# Patient Record
Sex: Female | Born: 1990 | Race: White | Hispanic: No | Marital: Married | Smoking: Current every day smoker
Health system: Southern US, Community
[De-identification: ages and names within clinical notes are randomized; demographics above are authoritative.]

## PROBLEM LIST (undated history)

## (undated) DIAGNOSIS — E0590 Thyrotoxicosis, unspecified without thyrotoxic crisis or storm: Secondary | ICD-10-CM

## (undated) HISTORY — PX: NO PAST SURGERIES: SHX2092

---

## 2017-03-27 ENCOUNTER — Encounter (HOSPITAL_COMMUNITY): Payer: Self-pay | Admitting: *Deleted

## 2017-03-27 ENCOUNTER — Emergency Department (HOSPITAL_COMMUNITY)
Admission: EM | Admit: 2017-03-27 | Discharge: 2017-03-27 | Disposition: A | Discharge status: Home / Self Care | Payer: Self-pay | Attending: Emergency Medicine | Admitting: Emergency Medicine

## 2017-03-27 DIAGNOSIS — R1031 Right lower quadrant pain: Secondary | ICD-10-CM | POA: Insufficient documentation

## 2017-03-27 DIAGNOSIS — Z5321 Procedure and treatment not carried out due to patient leaving prior to being seen by health care provider: Secondary | ICD-10-CM | POA: Insufficient documentation

## 2017-03-27 LAB — URINALYSIS, ROUTINE W REFLEX MICROSCOPIC
Bilirubin Urine: NEGATIVE
Glucose, UA: NEGATIVE mg/dL
Ketones, ur: NEGATIVE mg/dL
LEUKOCYTES UA: NEGATIVE
NITRITE: NEGATIVE
PROTEIN: NEGATIVE mg/dL
Specific Gravity, Urine: 1.025 (ref 1.005–1.030)
pH: 6 (ref 5.0–8.0)

## 2017-03-27 LAB — COMPREHENSIVE METABOLIC PANEL
ALK PHOS: 64 U/L (ref 38–126)
ALT: 39 U/L (ref 14–54)
AST: 26 U/L (ref 15–41)
Albumin: 4.2 g/dL (ref 3.5–5.0)
Anion gap: 9 (ref 5–15)
BUN: 18 mg/dL (ref 6–20)
CALCIUM: 9.1 mg/dL (ref 8.9–10.3)
CO2: 24 mmol/L (ref 22–32)
CREATININE: 0.83 mg/dL (ref 0.44–1.00)
Chloride: 106 mmol/L (ref 101–111)
Glucose, Bld: 124 mg/dL — ABNORMAL HIGH (ref 65–99)
Potassium: 3.7 mmol/L (ref 3.5–5.1)
Sodium: 139 mmol/L (ref 135–145)
Total Bilirubin: 0.5 mg/dL (ref 0.3–1.2)
Total Protein: 7.1 g/dL (ref 6.5–8.1)

## 2017-03-27 LAB — URINALYSIS, MICROSCOPIC (REFLEX)

## 2017-03-27 LAB — CBC
HCT: 46 % (ref 36.0–46.0)
Hemoglobin: 15.2 g/dL — ABNORMAL HIGH (ref 12.0–15.0)
MCH: 27 pg (ref 26.0–34.0)
MCHC: 33 g/dL (ref 30.0–36.0)
MCV: 81.7 fL (ref 78.0–100.0)
PLATELETS: 255 10*3/uL (ref 150–400)
RBC: 5.63 MIL/uL — AB (ref 3.87–5.11)
RDW: 13.1 % (ref 11.5–15.5)
WBC: 8.7 10*3/uL (ref 4.0–10.5)

## 2017-03-27 LAB — LIPASE, BLOOD: LIPASE: 33 U/L (ref 11–51)

## 2017-03-27 NOTE — ED Notes (Signed)
Patient left without speaking with Nurse First

## 2017-03-27 NOTE — ED Triage Notes (Signed)
Pt is here with right lower abdomen that radiates to the back for the last 3 weeks.  Pt also reports the pain increases with sex.  Pt sent here for an ultrasound to check it out.  LMP yesterday stopped

## 2017-03-27 NOTE — ED Notes (Signed)
Patient left without notifying Nurse first.

## 2017-04-21 ENCOUNTER — Encounter (HOSPITAL_COMMUNITY): Payer: Self-pay

## 2017-04-21 DIAGNOSIS — R1031 Right lower quadrant pain: Secondary | ICD-10-CM | POA: Insufficient documentation

## 2017-04-21 DIAGNOSIS — Z5321 Procedure and treatment not carried out due to patient leaving prior to being seen by health care provider: Secondary | ICD-10-CM | POA: Insufficient documentation

## 2017-04-21 LAB — URINALYSIS, ROUTINE W REFLEX MICROSCOPIC
BILIRUBIN URINE: NEGATIVE
GLUCOSE, UA: NEGATIVE mg/dL
HGB URINE DIPSTICK: NEGATIVE
KETONES UR: NEGATIVE mg/dL
LEUKOCYTES UA: NEGATIVE
NITRITE: NEGATIVE
PH: 6 (ref 5.0–8.0)
Protein, ur: 30 mg/dL — AB
SPECIFIC GRAVITY, URINE: 1.027 (ref 1.005–1.030)

## 2017-04-21 LAB — POC URINE PREG, ED: Preg Test, Ur: NEGATIVE

## 2017-04-21 NOTE — ED Triage Notes (Signed)
Pt here for exact same complaint as when she came in 8/17. Pt reports rlq/ pelvic pain that's worse with sex. Received std check at [facility] but here for [place]

## 2017-04-22 ENCOUNTER — Emergency Department (HOSPITAL_COMMUNITY)
Admission: EM | Admit: 2017-04-22 | Discharge: 2017-04-22 | Disposition: A | Discharge status: Home / Self Care | Payer: Self-pay | Attending: Emergency Medicine | Admitting: Emergency Medicine

## 2018-03-27 ENCOUNTER — Encounter

## 2018-04-22 ENCOUNTER — Other Ambulatory Visit: Payer: Self-pay | Admitting: Physician Assistant

## 2018-04-22 DIAGNOSIS — R109 Unspecified abdominal pain: Secondary | ICD-10-CM

## 2018-04-23 ENCOUNTER — Other Ambulatory Visit: Payer: Self-pay | Admitting: Physician Assistant

## 2018-04-23 ENCOUNTER — Ambulatory Visit
Admission: RE | Admit: 2018-04-23 | Discharge: 2018-04-23 | Disposition: A | Discharge status: Home / Self Care | Payer: BLUE CROSS/BLUE SHIELD | Source: Ambulatory Visit | Attending: Physician Assistant | Admitting: Physician Assistant

## 2018-04-23 DIAGNOSIS — R102 Pelvic and perineal pain: Secondary | ICD-10-CM

## 2018-10-21 IMAGING — US US PELVIS COMPLETE TRANSABD/TRANSVAG
1 series · 14 of 25 positions shown · non-contrast
Comparison: None

CLINICAL DATA: Right adnexal pain for 1 year, vaginal bleeding

EXAM:
TRANSABDOMINAL AND TRANSVAGINAL ULTRASOUND OF PELVIS
TECHNIQUE: Both transabdominal and transvaginal ultrasound examinations of the
pelvis were performed. Transabdominal technique was performed for
global imaging of the pelvis including uterus, ovaries, adnexal
regions, and pelvic cul-de-sac. It was necessary to proceed with
endovaginal exam following the transabdominal exam to visualize the
endometrium and adnexa.

[Series 1: us pelvis complete transabd/transvag · 0.23mm/px · 14 of 64 slices shown]
[im 1/64]
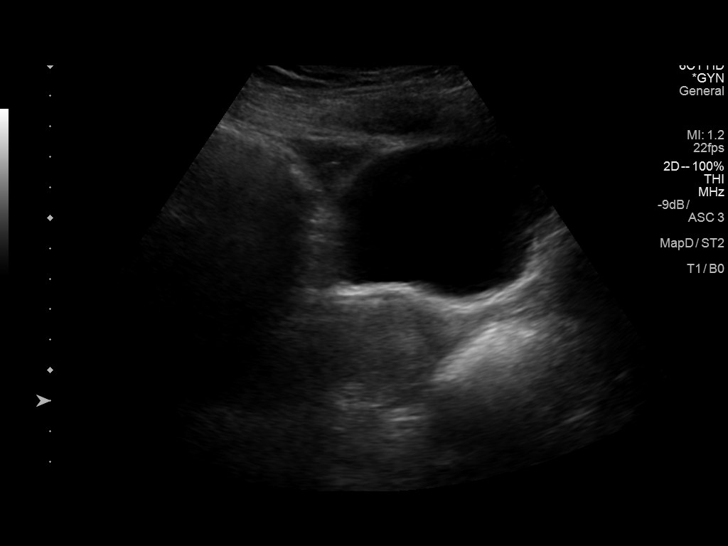
[im 6/64]
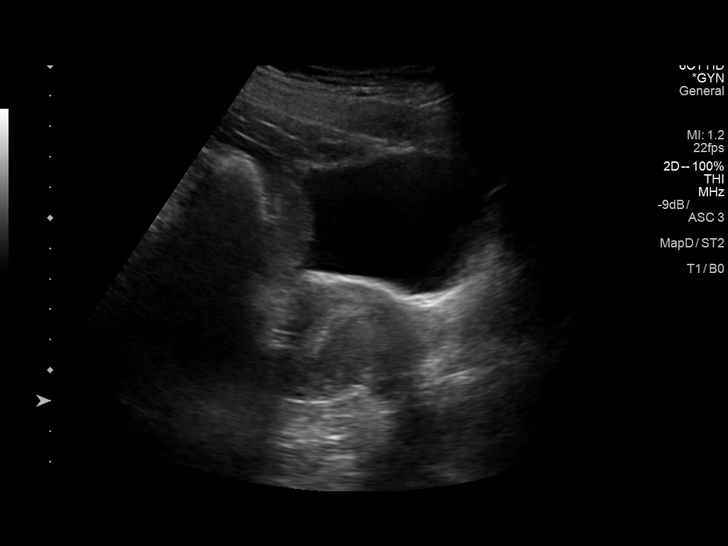
[im 11/64]
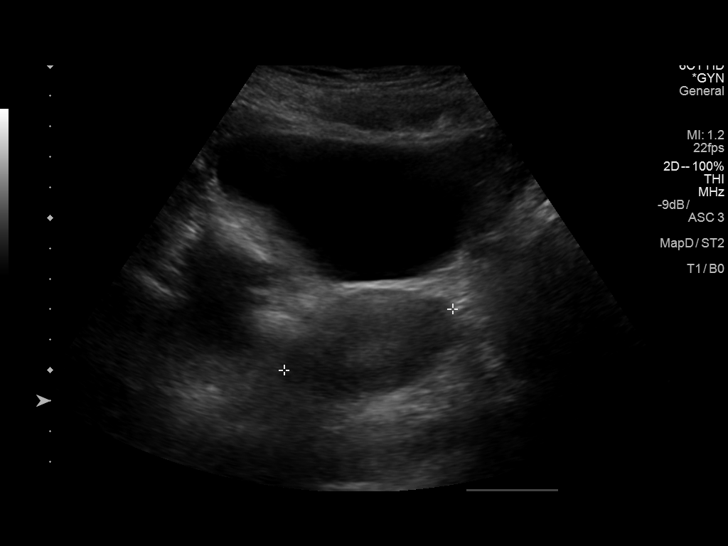
[im 16/64]
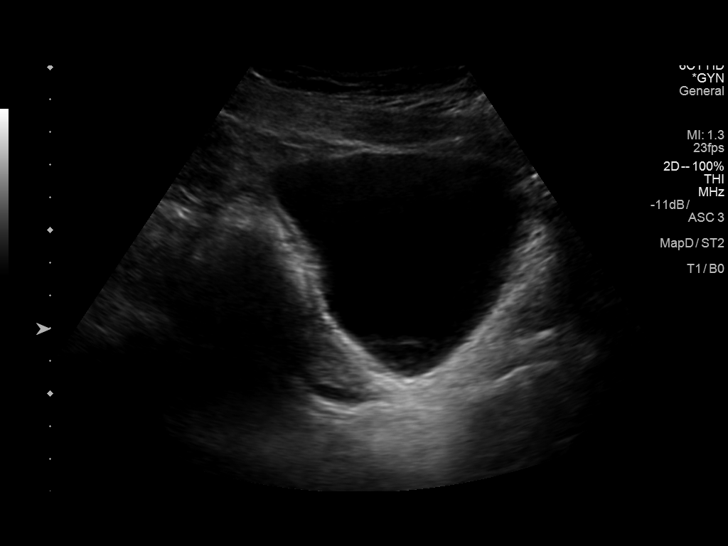
[im 22/64]
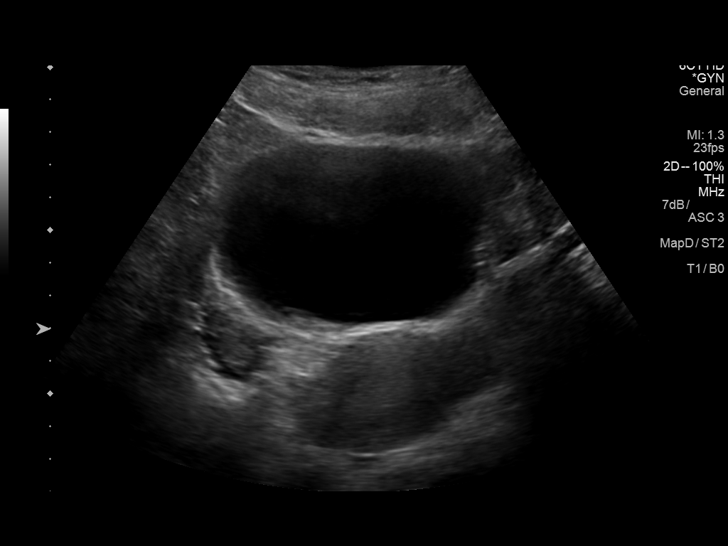
[im 24/64]
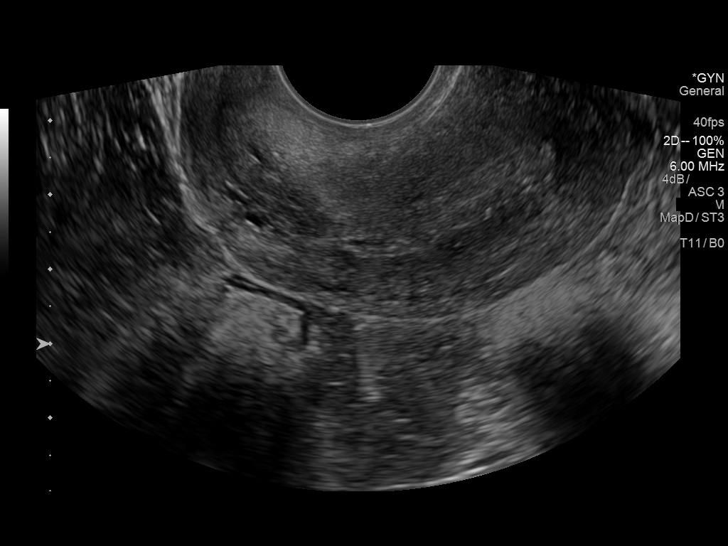
[im 29/64]
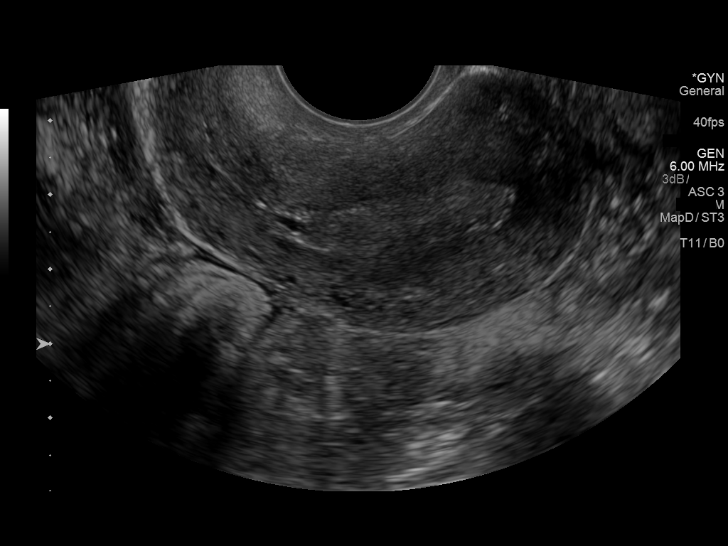
[im 35/64]
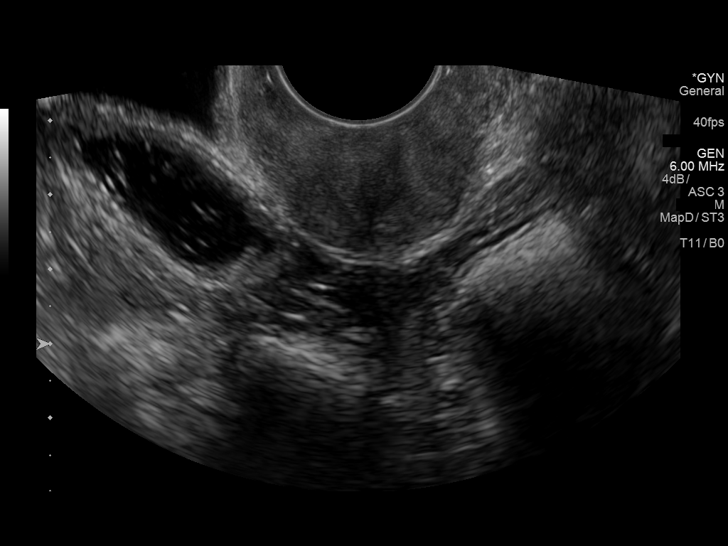
[im 40/64]
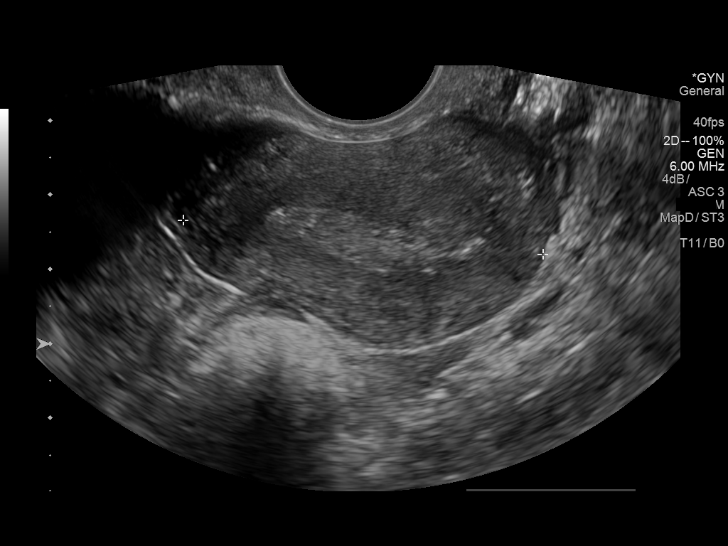
[im 43/64]
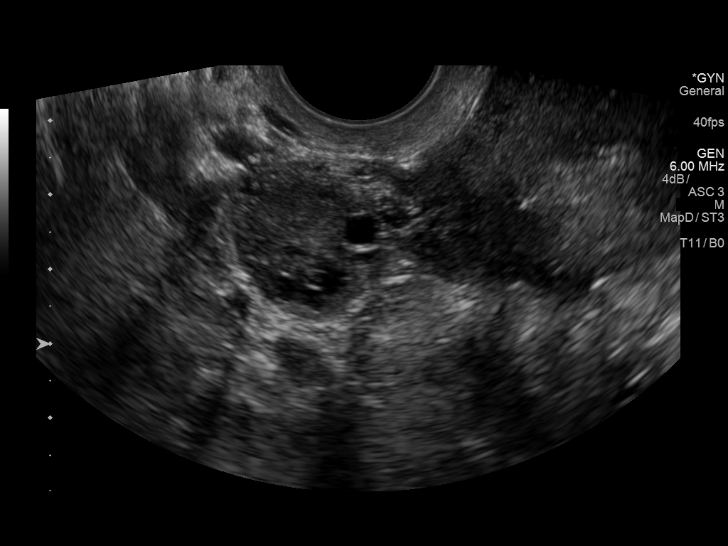
[im 48/64]
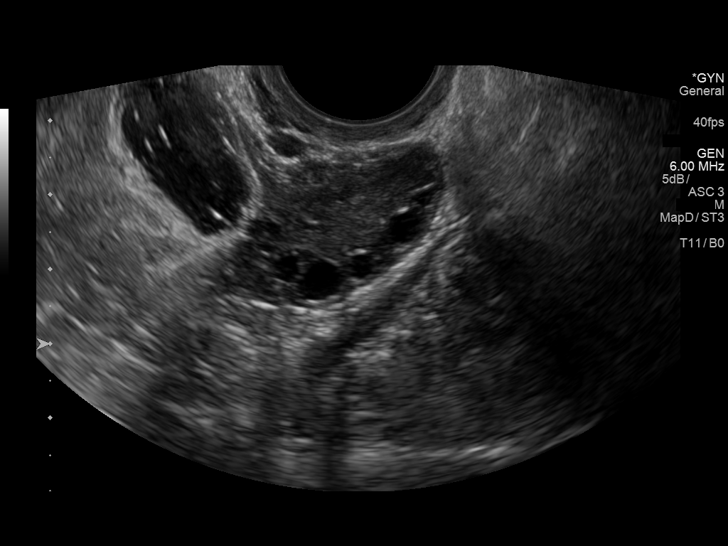
[im 53/64]
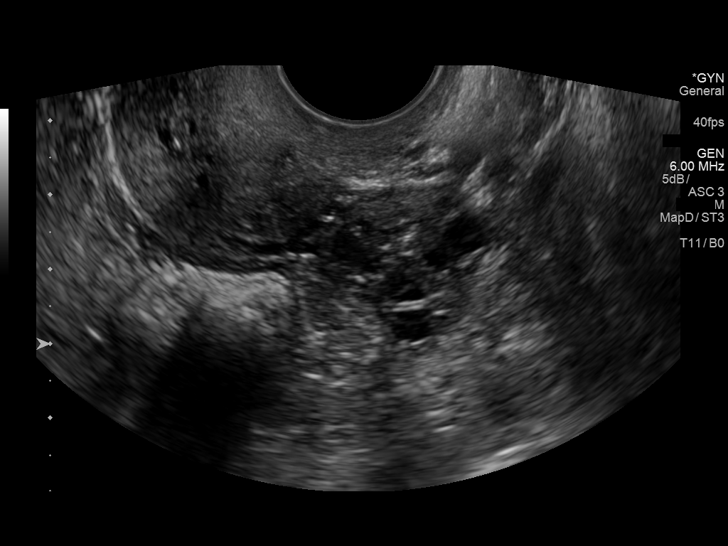
[im 58/64]
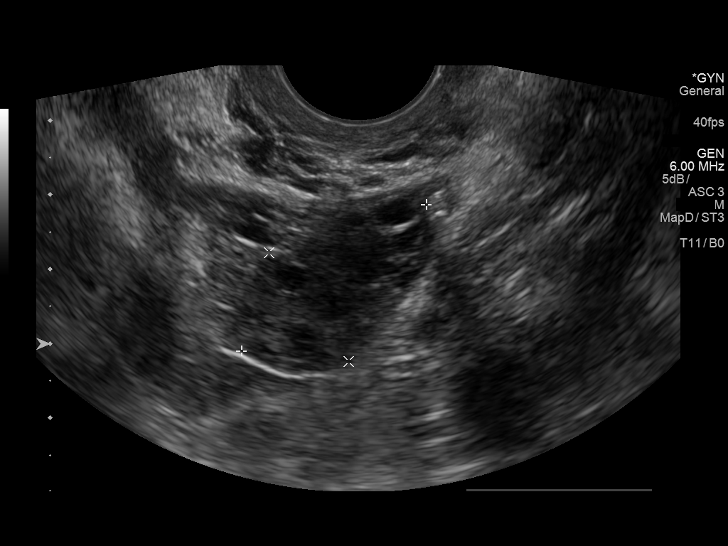
[im 64/64]
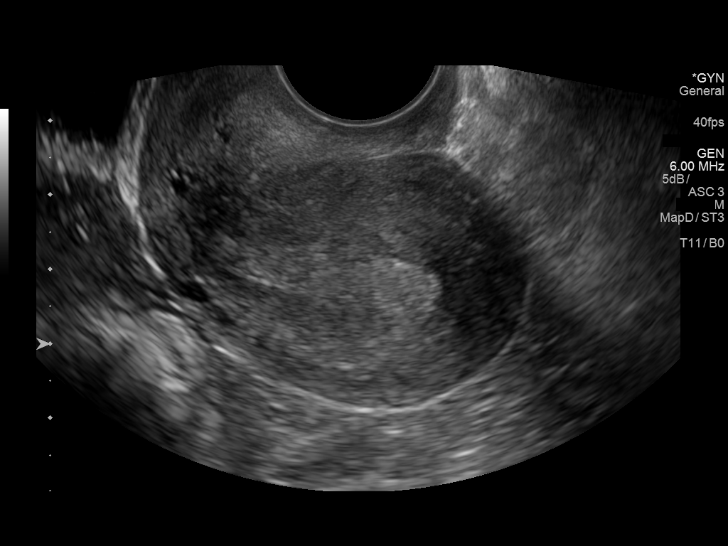

[14 of 25 positions shown; findings below may reference images not displayed]

FINDINGS: Uterus

Measurements: 6.9 x 3.5 x 4.9 cm. No fibroids or other mass
visualized.

Endometrium

Thickness: 6.9 mm.  No focal abnormality visualized.

Right ovary

Measurements: 3.3 x 1.9 x 2.1 cm. Normal appearance/no adnexal mass.

Left ovary

Measurements: 3.2 x 1.8 x 2.4 cm. Normal appearance/no adnexal mass.

Other findings

No abnormal free fluid.
IMPRESSION: Negative pelvic ultrasound.

## 2019-04-09 ENCOUNTER — Emergency Department (HOSPITAL_COMMUNITY): Payer: Self-pay

## 2019-04-09 ENCOUNTER — Other Ambulatory Visit: Payer: Self-pay

## 2019-04-09 ENCOUNTER — Encounter (HOSPITAL_COMMUNITY): Payer: Self-pay

## 2019-04-09 ENCOUNTER — Emergency Department (HOSPITAL_COMMUNITY)
Admission: EM | Admit: 2019-04-09 | Discharge: 2019-04-09 | Disposition: A | Discharge status: Home / Self Care | Payer: Self-pay | Attending: Emergency Medicine | Admitting: Emergency Medicine

## 2019-04-09 DIAGNOSIS — Y999 Unspecified external cause status: Secondary | ICD-10-CM | POA: Insufficient documentation

## 2019-04-09 DIAGNOSIS — S61354A Open bite of right ring finger with damage to nail, initial encounter: Secondary | ICD-10-CM | POA: Insufficient documentation

## 2019-04-09 DIAGNOSIS — Y92009 Unspecified place in unspecified non-institutional (private) residence as the place of occurrence of the external cause: Secondary | ICD-10-CM | POA: Insufficient documentation

## 2019-04-09 DIAGNOSIS — S6991XA Unspecified injury of right wrist, hand and finger(s), initial encounter: Secondary | ICD-10-CM

## 2019-04-09 DIAGNOSIS — F17210 Nicotine dependence, cigarettes, uncomplicated: Secondary | ICD-10-CM | POA: Insufficient documentation

## 2019-04-09 DIAGNOSIS — Y939 Activity, unspecified: Secondary | ICD-10-CM | POA: Insufficient documentation

## 2019-04-09 DIAGNOSIS — W540XXA Bitten by dog, initial encounter: Secondary | ICD-10-CM | POA: Insufficient documentation

## 2019-04-09 MED ORDER — OXYCODONE-ACETAMINOPHEN 5-325 MG PO TABS: 2.0000 | ORAL_TABLET | ORAL | 0 refills | Status: AC | PRN

## 2019-04-09 MED ORDER — AMOXICILLIN-POT CLAVULANATE 875-125 MG PO TABS: 1.0000 | ORAL_TABLET | Freq: Two times a day (BID) | ORAL | 0 refills | Status: AC

## 2019-04-09 MED ORDER — AMOXICILLIN-POT CLAVULANATE 875-125 MG PO TABS: 1.0000 | ORAL_TABLET | Freq: Two times a day (BID) | ORAL | 0 refills | Status: DC

## 2019-04-09 MED ORDER — ACETAMINOPHEN 325 MG PO TABS
650.0000 mg | ORAL_TABLET | Freq: Once | ORAL | Status: AC
  Administered 2019-04-09: 650 mg via ORAL
  Filled 2019-04-09: qty 2

## 2019-04-09 NOTE — Discharge Instructions (Addendum)
Please note that the dog who bit you needs to be quarantine for 14 days to prove that he does not have rabies.  I have provided antibiotics to help prevent any further infection, please take 1 tablet twice a day for the next 7 days.  The number to Dr. Apolonio Schneiders, hand specialist is attached to your chart please call Monday morning to setup an appointment.

## 2019-04-09 NOTE — ED Provider Notes (Signed)
  Face-to-face evaluation   History: She presents for evaluation of injury to right finger four, when her dog bit it, today.  She states she has paper showing that dog's rabies immunization is up-to-date.  Physical exam: Right finger 4 with angulated distal phalanx injury, visible bone fragment protruding through proximal nailbed and nail.  Patient examined after digital block.  Medical screening examination/treatment/procedure(s) were conducted as a shared visit with non-physician practitioner(s) and myself.  I personally evaluated the patient during the encounter    Daleen Bo, MD 04/10/19 1017

## 2019-04-09 NOTE — ED Triage Notes (Signed)
Dog bite about one hour ago states it is her dog and has proof of rabies vaccine, right ring finger with dried blood on it no other injury. Had tetanus shot one year ago.

## 2019-04-09 NOTE — ED Notes (Addendum)
Non-adhesive gauze applied to wound, wrapped in gauze with finger splint.

## 2019-04-09 NOTE — ED Notes (Signed)
No respiratory or acute distress noted alert and oriented x 3 call light in reach visitor at bedside no reaction to medication noted. 

## 2019-04-09 NOTE — ED Notes (Signed)
Reported animal bite to [employer].

## 2019-04-09 NOTE — ED Notes (Signed)
An After Visit Summary was printed and given to the patient. Discharge instructions given and no further questions at this time.  

## 2019-04-09 NOTE — ED Provider Notes (Signed)
[facility]-EMERGENCY DEPT Provider Note   CSN: [id number] Arrival date & time: 04/09/19  1632     History   Chief Complaint Chief Complaint  Patient presents with  . Animal Bite    HPI Taylor Dawson is a 28 y.o. female.     28 y.o female with no PMH presents to the ED s/p dog bite. Patient reports she bought a dog yesterday, states this morning while playing with them she was bite on her right ring finger. She endorses pain at the site, no medical tried for relieve in symptoms. She reports the dog is currently up the date with his vaccines and is being quarantined by the the Health department. She also reports her last tetanus vaccines was one year ago. She denies any other injuries or complaints.   The history is provided by the patient and medical records.  Animal Bite Associated symptoms: no fever     History reviewed. No pertinent past medical history.  There are no active problems to display for this patient.   History reviewed. No pertinent surgical history.   OB History   No obstetric history on file.      Home Medications    Prior to Admission medications   Medication Sig Start Date End Date Taking? Authorizing Provider  acetaminophen (TYLENOL) 325 MG tablet Take 650 mg by mouth every 6 (six) hours as needed for moderate pain.   Yes [provider]  amoxicillin-clavulanate (AUGMENTIN) 875-125 MG tablet Take 1 tablet by mouth 2 (two) times daily for 7 days. 04/09/19 04/16/19  Claude Manges, PA-C  oxyCODONE-acetaminophen (PERCOCET/ROXICET) 5-325 MG tablet Take 2 tablets by mouth every 4 (four) hours as needed for up to 3 days for severe pain. 04/09/19 04/12/19  Claude Manges, PA-C    Family History History reviewed. No pertinent family history.  Social History Social History   Tobacco Use  . Smoking status: Current Every Day Smoker  . Smokeless tobacco: Never Used  Substance Use Topics  . Alcohol use: No  . Drug use: No      Allergies   Patient has no known allergies.   Review of Systems Review of Systems  Constitutional: Negative for fever.  Skin: Positive for wound.     Physical Exam Updated Vital Signs BP (!) 137/96   Pulse (!) 102   Temp 98.6 F (37 C) (Oral)   Resp 16   Ht 5\' 1"  (1.549 m)   Wt 75.3 kg   SpO2 96%   BMI 31.37 kg/m   Physical Exam Vitals signs and nursing note reviewed.  Constitutional:      General: She is not in acute distress.    Appearance: She is well-developed.  HENT:     Head: Normocephalic and atraumatic.     Mouth/Throat:     Pharynx: No oropharyngeal exudate.  Eyes:     Pupils: Pupils are equal, round, and reactive to light.  Neck:     Musculoskeletal: Normal range of motion.  Cardiovascular:     Rate and Rhythm: Regular rhythm.     Heart sounds: Normal heart sounds.  Pulmonary:     Effort: Pulmonary effort is normal. No respiratory distress.     Breath sounds: Normal breath sounds.  Abdominal:     General: Bowel sounds are normal. There is no distension.     Palpations: Abdomen is soft.     Tenderness: There is no abdominal tenderness.  Musculoskeletal:        General:  No deformity.     Right hand: She exhibits tenderness, laceration and swelling. Normal sensation noted. Normal strength noted.       Hands:     Right lower leg: No edema.     Left lower leg: No edema.     Comments: Pulses present,.Strength is slightly decrease due to pain. Bony prominence likely distal phalanx open fracture.   Skin:    General: Skin is warm and dry.  Neurological:     Mental Status: She is alert and oriented to person, place, and time.          ED Treatments / Results  Labs (all labs ordered are listed, but only abnormal results are displayed) Labs Reviewed - No data to display  EKG None  Radiology Dg Finger Ring Right  Result Date: 04/09/2019 CLINICAL DATA:  Dog bite. EXAM: RIGHT RING FINGER 2+V COMPARISON:  None. FINDINGS: There is a comminuted  displaced fracture through the distal phalanx of the fourth finger. Based on the lateral view, the fracture appears to be open. Soft tissue swelling is identified. No foreign bodies are noted. IMPRESSION: Open comminuted fracture through the distal fourth phalanx. The portion of the phalanx proximal 2 the fracture is deviated in a dorsal direction and appears to pierce the skin. Electronically Signed   By: Dorise Bullion III M.D   On: 04/09/2019 18:53    Procedures Procedures (including critical care time)  Medications Ordered in ED Medications  acetaminophen (TYLENOL) tablet 650 mg (650 mg Oral Given 04/09/19 1745)     Initial Impression / Assessment and Plan / ED Course  I have reviewed the triage vital signs and the nursing notes.  Pertinent labs & imaging results that were available during my care of the patient were reviewed by me and considered in my medical decision making (see chart for details).       Patient with no pertinent past medical history presents to ED status post dog bite, she reports obtaining a dog yesterday from a breeder, was bit by his dog this morning.  According to patient dog will be quarantined by animal control for the next 10 days.  They report her dog is up-to-date with his vaccines.  Her last tetanus shot was last year.  Bleeding has been controlled with pressure.  Exam is unremarkable, she is neurovascularly intact, right right finger appears swollen and decrease ROM due to pain. Will irrigate extensively, upon furthering reexamination bony prominence appears to be disrupting the nailbed, will obtain xray to further evaluate injury.  Xray of her finger showed: Open comminuted fracture through the distal fourth phalanx. The  portion of the phalanx proximal 2 the fracture is deviated in a  dorsal direction and appears to pierce the skin.     I have discussed case with Dr. Eulis Foster who has seen patient and agrees with management.   7:28 PM Spoke to Dr.  Apolonio Schneiders hand specialist who advised patient to be place on dressing and he will see patient in office this week. I will personally provide patient with dressing and augmentin prescription. Patient understands and agrees with management. Will also write a prescription for pain control.   Portions of this note were generated with [employer]. Dictation errors may occur despite best attempts at proofreading.    Final Clinical Impressions(s) / ED Diagnoses   Final diagnoses:  Dog bite, initial encounter  Injury of right ring finger, initial encounter    ED Discharge Orders  Ordered    amoxicillin-clavulanate (AUGMENTIN) 875-125 MG tablet  2 times daily,   Status:  Discontinued     04/09/19 1742    amoxicillin-clavulanate (AUGMENTIN) 875-125 MG tablet  2 times daily     04/09/19 1920    oxyCODONE-acetaminophen (PERCOCET/ROXICET) 5-325 MG tablet  Every 4 hours PRN     04/09/19 1927           Claude MangesSoto, Chava Dulac, PA-C 04/09/19 1928    Mancel BaleWentz, Elliott, MD 04/10/19 1017

## 2019-04-12 ENCOUNTER — Other Ambulatory Visit: Payer: Self-pay

## 2019-04-12 ENCOUNTER — Other Ambulatory Visit (HOSPITAL_COMMUNITY)
Admission: RE | Admit: 2019-04-12 | Discharge: 2019-04-12 | Disposition: A | Discharge status: Home / Self Care | Payer: HRSA Program | Source: Ambulatory Visit | Attending: Orthopedic Surgery | Admitting: Orthopedic Surgery

## 2019-04-12 ENCOUNTER — Encounter (HOSPITAL_BASED_OUTPATIENT_CLINIC_OR_DEPARTMENT_OTHER): Payer: Self-pay | Admitting: *Deleted

## 2019-04-12 DIAGNOSIS — Z20828 Contact with and (suspected) exposure to other viral communicable diseases: Secondary | ICD-10-CM | POA: Insufficient documentation

## 2019-04-12 DIAGNOSIS — Z01812 Encounter for preprocedural laboratory examination: Secondary | ICD-10-CM | POA: Diagnosis present

## 2019-04-12 LAB — SARS CORONAVIRUS 2 (TAT 6-24 HRS): SARS Coronavirus 2: NEGATIVE

## 2019-04-12 NOTE — H&P (Signed)
  Taylor Dawson is an 28 y.o. female.   Chief Complaint: DOG BITE TO THE RIGHT HAND  HPI: The patient is a 28y/o right hand dominant female who was breaking up a dog fight on 04/09/19 and was bit by her own dog. She was seen in the emergency department for initial treatment. She has been taking Augmentin and denies having any drainage, spreading redness, or warmth of the finger.  She was seen in our office for further evaluation. Discussed the reason and rationale for surgery.  She continues to keep the wound clean and dry.  Denies chest pain, shortness of breath, fever, chills, nausea, vomiting, or diarrhea.    Past Medical History:  Diagnosis Date  . Hyperthyroidism    no meds    Past Surgical History:  Procedure Laterality Date  . NO PAST SURGERIES      History reviewed. No pertinent family history. Social History:  reports that she has been smoking cigarettes. She has been smoking about 0.25 packs per day. She has never used smokeless tobacco. She reports that she does not drink alcohol or use drugs.  Allergies: No Known Allergies  No medications prior to admission.    No results found for this or any previous visit (from the past 48 hour(s)). No results found.  ROS NO RECENT ILLNESSES OR HOSPITALIZATIONS  Height 5\' 1"  (1.549 m), weight 75.3 kg, last menstrual period 01/24/2019. Physical Exam  General Appearance:  Alert, cooperative, no distress, appears stated age  Head:  Normocephalic, without obvious abnormality, atraumatic  Eyes:  Pupils equal, conjunctiva/corneas clear,         Throat: Lips, mucosa, and tongue normal; teeth and gums normal  Neck: No visible masses     Lungs:   respirations unlabored  Chest Wall:  No tenderness or deformity  Heart:  Regular rate and rhythm,  Abdomen:   Soft, non-tender,         Extremities: RUE: OPEN WOUND OF THE HARD NAIL OF THE RING FINGER WITHOUT ERYTHEMA, INCREASED WARMTH, OR PURULENT DRAINAGE. SENSATION INTACT TO LIGHT  TOUCH DISTALLY. CAPILLARY REFILL LESS THAN 2 SECONDS. LIMITED FLEXION OF THE RING FINGER. TENDERNESS TO PALPATION OF THE DISTAL PHALANX OF THE RING FINGER. ALL OTHER DIGITS ARE NONTENDER TO PALPATION AND HAVE FULL RANGE OF MOTION.  Pulses: 2+ and symmetric  Skin: Skin color, texture, turgor normal, no rashes or lesions     Neurologic: Normal    Assessment RIGHT RING FINGER OPEN FRACTURE OF DISTAL PHALANX   Plan RIGHT RING FINGER OPEN REDUCTION AND INTERNAL FIXATION WITH REPAIR AS INDICATED   WE ARE PLANNING SURGERY FOR YOUR UPPER EXTREMITY. THE RISKS AND BENEFITS OF SURGERY INCLUDE BUT NOT LIMITED TO BLEEDING INFECTION, DAMAGE TO NEARBY NERVES ARTERIES TENDONS, FAILURE OF SURGERY TO ACCOMPLISH ITS INTENDED GOALS, PERSISTENT SYMPTOMS AND NEED FOR FURTHER SURGICAL INTERVENTION. WITH THIS IN MIND WE WILL PROCEED. I HAVE DISCUSSED WITH THE PATIENT THE PRE AND POSTOPERATIVE REGIMEN AND THE DOS AND DON'TS. PT VOICED UNDERSTANDING AND INFORMED CONSENT SIGNED.  R/B/A DISCUSSED WITH PT IN OFFICE.  PT VOICED UNDERSTANDING OF PLAN CONSENT SIGNED DAY OF SURGERY PT SEEN AND EXAMINED PRIOR TO OPERATIVE PROCEDURE/DAY OF SURGERY SITE MARKED. QUESTIONS ANSWERED [place] MD  Brynda Peon 04/12/2019, 11:25 AM

## 2019-04-13 ENCOUNTER — Ambulatory Visit (HOSPITAL_BASED_OUTPATIENT_CLINIC_OR_DEPARTMENT_OTHER): Payer: Self-pay | Admitting: Certified Registered"

## 2019-04-13 ENCOUNTER — Other Ambulatory Visit: Payer: Self-pay

## 2019-04-13 ENCOUNTER — Encounter (HOSPITAL_BASED_OUTPATIENT_CLINIC_OR_DEPARTMENT_OTHER): Payer: Self-pay | Admitting: *Deleted

## 2019-04-13 ENCOUNTER — Ambulatory Visit (HOSPITAL_BASED_OUTPATIENT_CLINIC_OR_DEPARTMENT_OTHER)
Admission: RE | Admit: 2019-04-13 | Discharge: 2019-04-13 | Disposition: A | Discharge status: Home / Self Care | Payer: Self-pay | Attending: Orthopedic Surgery | Admitting: Orthopedic Surgery

## 2019-04-13 ENCOUNTER — Encounter (HOSPITAL_BASED_OUTPATIENT_CLINIC_OR_DEPARTMENT_OTHER)
Admission: RE | Disposition: A | Discharge status: Home / Self Care | Payer: Self-pay | Source: Home / Self Care | Attending: Orthopedic Surgery

## 2019-04-13 DIAGNOSIS — E0590 Thyrotoxicosis, unspecified without thyrotoxic crisis or storm: Secondary | ICD-10-CM | POA: Insufficient documentation

## 2019-04-13 DIAGNOSIS — W540XXA Bitten by dog, initial encounter: Secondary | ICD-10-CM | POA: Insufficient documentation

## 2019-04-13 DIAGNOSIS — S61451A Open bite of right hand, initial encounter: Secondary | ICD-10-CM

## 2019-04-13 DIAGNOSIS — F17210 Nicotine dependence, cigarettes, uncomplicated: Secondary | ICD-10-CM | POA: Insufficient documentation

## 2019-04-13 DIAGNOSIS — S62634B Displaced fracture of distal phalanx of right ring finger, initial encounter for open fracture: Secondary | ICD-10-CM | POA: Insufficient documentation

## 2019-04-13 HISTORY — DX: Thyrotoxicosis, unspecified without thyrotoxic crisis or storm: E05.90

## 2019-04-13 HISTORY — PX: CLOSED REDUCTION FINGER WITH PERCUTANEOUS PINNING: SHX5612

## 2019-04-13 SURGERY — CLOSED REDUCTION, FINGER, WITH PERCUTANEOUS PINNING
Anesthesia: Regional | Site: Hand | Laterality: Right

## 2019-04-13 MED ORDER — PROPOFOL 500 MG/50ML IV EMUL
INTRAVENOUS | Status: DC | PRN
  Administered 2019-04-13: 120 ug/kg/min via INTRAVENOUS

## 2019-04-13 MED ORDER — FENTANYL CITRATE (PF) 100 MCG/2ML IJ SOLN
INTRAMUSCULAR | Status: AC
  Filled 2019-04-13: qty 2

## 2019-04-13 MED ORDER — ROPIVACAINE HCL 5 MG/ML IJ SOLN
INTRAMUSCULAR | Status: DC | PRN
  Administered 2019-04-13: 30 mL

## 2019-04-13 MED ORDER — LACTATED RINGERS IV SOLN
INTRAVENOUS | Status: DC
  Administered 2019-04-13: 14:00:00 via INTRAVENOUS

## 2019-04-13 MED ORDER — CEFAZOLIN SODIUM-DEXTROSE 2-4 GM/100ML-% IV SOLN
2.0000 g | INTRAVENOUS | Status: AC
  Administered 2019-04-13: 2 g via INTRAVENOUS

## 2019-04-13 MED ORDER — LIDOCAINE 2% (20 MG/ML) 5 ML SYRINGE
INTRAMUSCULAR | Status: DC | PRN
  Administered 2019-04-13: 40 mg via INTRAVENOUS

## 2019-04-13 MED ORDER — HYDROMORPHONE HCL 1 MG/ML IJ SOLN: 0.2500 mg | INTRAMUSCULAR | Status: DC | PRN

## 2019-04-13 MED ORDER — PROMETHAZINE HCL 25 MG/ML IJ SOLN: 6.2500 mg | INTRAMUSCULAR | Status: DC | PRN

## 2019-04-13 MED ORDER — MIDAZOLAM HCL 2 MG/2ML IJ SOLN
1.0000 mg | INTRAMUSCULAR | Status: DC | PRN
  Administered 2019-04-13: 14:00:00 2 mg via INTRAVENOUS

## 2019-04-13 MED ORDER — CHLORHEXIDINE GLUCONATE 4 % EX LIQD: 60.0000 mL | Freq: Once | CUTANEOUS | Status: DC

## 2019-04-13 MED ORDER — HYDROCODONE-ACETAMINOPHEN 7.5-325 MG PO TABS: 1.0000 | ORAL_TABLET | Freq: Once | ORAL | Status: DC | PRN

## 2019-04-13 MED ORDER — FENTANYL CITRATE (PF) 100 MCG/2ML IJ SOLN
50.0000 ug | INTRAMUSCULAR | Status: DC | PRN
  Administered 2019-04-13: 100 ug via INTRAVENOUS

## 2019-04-13 MED ORDER — MEPERIDINE HCL 25 MG/ML IJ SOLN: 6.2500 mg | INTRAMUSCULAR | Status: DC | PRN

## 2019-04-13 MED ORDER — CLONIDINE HCL (ANALGESIA) 100 MCG/ML EP SOLN
EPIDURAL | Status: DC | PRN
  Administered 2019-04-13: 100 ug

## 2019-04-13 MED ORDER — CEFAZOLIN SODIUM-DEXTROSE 2-4 GM/100ML-% IV SOLN
INTRAVENOUS | Status: AC
  Filled 2019-04-13: qty 100

## 2019-04-13 MED ORDER — DEXMEDETOMIDINE HCL IN NACL 400 MCG/100ML IV SOLN
INTRAVENOUS | Status: DC | PRN
  Administered 2019-04-13: 8 ug via INTRAVENOUS

## 2019-04-13 MED ORDER — ACETAMINOPHEN 10 MG/ML IV SOLN: 1000.0000 mg | Freq: Once | INTRAVENOUS | Status: DC | PRN

## 2019-04-13 MED ORDER — MIDAZOLAM HCL 2 MG/2ML IJ SOLN
INTRAMUSCULAR | Status: AC
  Filled 2019-04-13: qty 2

## 2019-04-13 SURGICAL SUPPLY — 63 items
BENZOIN TINCTURE PRP APPL 2/3 (GAUZE/BANDAGES/DRESSINGS) ×3 IMPLANT
BLADE SURG 15 STRL LF DISP TIS (BLADE) ×4 IMPLANT
BLADE SURG 15 STRL SS (BLADE) ×2
BNDG ELASTIC 3X5.8 VLCR STR LF (GAUZE/BANDAGES/DRESSINGS) ×3 IMPLANT
BNDG ELASTIC 4X5.8 VLCR STR LF (GAUZE/BANDAGES/DRESSINGS) IMPLANT
BNDG ESMARK 4X9 LF (GAUZE/BANDAGES/DRESSINGS) ×3 IMPLANT
BNDG GAUZE ELAST 4 BULKY (GAUZE/BANDAGES/DRESSINGS) ×3 IMPLANT
CANISTER SUCT 1200ML W/VALVE (MISCELLANEOUS) IMPLANT
CORD BIPOLAR FORCEPS 12FT (ELECTRODE) ×3 IMPLANT
COVER BACK TABLE REUSABLE LG (DRAPES) ×3 IMPLANT
COVER WAND RF STERILE (DRAPES) IMPLANT
CUFF TOURN SGL QUICK 18X4 (TOURNIQUET CUFF) IMPLANT
DECANTER SPIKE VIAL GLASS SM (MISCELLANEOUS) IMPLANT
DRAPE EXTREMITY T 121X128X90 (DISPOSABLE) ×3 IMPLANT
DRAPE HALF SHEET 70X43 (DRAPES) IMPLANT
DRAPE OEC MINIVIEW 54X84 (DRAPES) ×3 IMPLANT
DRAPE SURG 17X23 STRL (DRAPES) ×3 IMPLANT
DRSG EMULSION OIL 3X3 NADH (GAUZE/BANDAGES/DRESSINGS) ×3 IMPLANT
GAUZE 4X4 16PLY RFD (DISPOSABLE) IMPLANT
GLOVE BIO SURGEON STRL SZ 6.5 (GLOVE) ×3 IMPLANT
GLOVE BIO SURGEON STRL SZ8 (GLOVE) ×3 IMPLANT
GLOVE BIOGEL PI IND STRL 7.0 (GLOVE) ×2 IMPLANT
GLOVE BIOGEL PI IND STRL 8 (GLOVE) ×2 IMPLANT
GLOVE BIOGEL PI IND STRL 8.5 (GLOVE) ×2 IMPLANT
GLOVE BIOGEL PI INDICATOR 7.0 (GLOVE) ×1
GLOVE BIOGEL PI INDICATOR 8 (GLOVE) ×1
GLOVE BIOGEL PI INDICATOR 8.5 (GLOVE) ×1
GOWN STRL REUS W/ TWL LRG LVL3 (GOWN DISPOSABLE) ×2 IMPLANT
GOWN STRL REUS W/ TWL XL LVL3 (GOWN DISPOSABLE) ×2 IMPLANT
GOWN STRL REUS W/TWL LRG LVL3 (GOWN DISPOSABLE) ×1
GOWN STRL REUS W/TWL XL LVL3 (GOWN DISPOSABLE) ×1
K-WIRE .035X4 (WIRE) ×6 IMPLANT
K-WIRE .045X4 (WIRE) ×3 IMPLANT
NEEDLE HYPO 22GX1.5 SAFETY (NEEDLE) IMPLANT
NEEDLE HYPO 25X1 1.5 SAFETY (NEEDLE) IMPLANT
NS IRRIG 1000ML POUR BTL (IV SOLUTION) ×3 IMPLANT
PACK BASIN DAY SURGERY FS (CUSTOM PROCEDURE TRAY) ×3 IMPLANT
PAD CAST 3X4 CTTN HI CHSV (CAST SUPPLIES) ×2 IMPLANT
PAD CAST 4YDX4 CTTN HI CHSV (CAST SUPPLIES) IMPLANT
PADDING CAST ABS 3INX4YD NS (CAST SUPPLIES) ×1
PADDING CAST ABS 4INX4YD NS (CAST SUPPLIES) ×1
PADDING CAST ABS COTTON 3X4 (CAST SUPPLIES) ×2 IMPLANT
PADDING CAST ABS COTTON 4X4 ST (CAST SUPPLIES) ×2 IMPLANT
PADDING CAST COTTON 3X4 STRL (CAST SUPPLIES) ×1
PADDING CAST COTTON 4X4 STRL (CAST SUPPLIES)
SLING ARM FOAM STRAP MED (SOFTGOODS) ×3 IMPLANT
SPLINT FIBERGLASS 3X35 (CAST SUPPLIES) IMPLANT
SPLINT FIBERGLASS 4X30 (CAST SUPPLIES) IMPLANT
SPLINT FINGER 3.25 911903 (SOFTGOODS) ×3 IMPLANT
STOCKINETTE 4X48 STRL (DRAPES) ×3 IMPLANT
STRIP CLOSURE SKIN 1/2X4 (GAUZE/BANDAGES/DRESSINGS) IMPLANT
SUCTION FRAZIER HANDLE 10FR (MISCELLANEOUS)
SUCTION TUBE FRAZIER 10FR DISP (MISCELLANEOUS) IMPLANT
SUT MON AB 4-0 PC3 18 (SUTURE) IMPLANT
SUT PROLENE 4 0 PS 2 18 (SUTURE) ×3 IMPLANT
SUT VIC AB 3-0 FS2 27 (SUTURE) ×3 IMPLANT
SUT VICRYL 4-0 PS2 18IN ABS (SUTURE) ×3 IMPLANT
SYR BULB 3OZ (MISCELLANEOUS) ×3 IMPLANT
SYR CONTROL 10ML LL (SYRINGE) IMPLANT
TOWEL GREEN STERILE FF (TOWEL DISPOSABLE) ×6 IMPLANT
TRAY DSU PREP LF (CUSTOM PROCEDURE TRAY) ×3 IMPLANT
TUBE CONNECTING 20X1/4 (TUBING) IMPLANT
UNDERPAD 30X36 HEAVY ABSORB (UNDERPADS AND DIAPERS) ×3 IMPLANT

## 2019-04-13 NOTE — Discharge Instructions (Signed)
KEEP BANDAGE CLEAN AND DRY CALL OFFICE FOR F/U APPT [phone] IN 9 DAYS  KEEP HAND ELEVATED ABOVE HEART OK TO APPLY ICE TO OPERATIVE AREA CONTACT OFFICE IF ANY WORSENING PAIN OR CONCERNS.   Post Anesthesia Home Care Instructions  Activity: Get plenty of rest for the remainder of the day. A responsible individual must stay with you for 24 hours following the procedure.  For the next 24 hours, DO NOT: -Drive a car -Paediatric nurse -Drink alcoholic beverages -Take any medication unless instructed by your physician -Make any legal decisions or sign important papers.  Meals: Start with liquid foods such as gelatin or soup. Progress to regular foods as tolerated. Avoid greasy, spicy, heavy foods. If nausea and/or vomiting occur, drink only clear liquids until the nausea and/or vomiting subsides. Call your physician if vomiting continues.  Special Instructions/Symptoms: Your throat may feel dry or sore from the anesthesia or the breathing tube placed in your throat during surgery. If this causes discomfort, gargle with warm salt water. The discomfort should disappear within 24 hours.  If you had a scopolamine patch placed behind your ear for the management of post- operative nausea and/or vomiting:  1. The medication in the patch is effective for 72 hours, after which it should be removed.  Wrap patch in a tissue and discard in the trash. Wash hands thoroughly with soap and water. 2. You may remove the patch earlier than 72 hours if you experience unpleasant side effects which may include dry mouth, dizziness or visual disturbances. 3. Avoid touching the patch. Wash your hands with soap and water after contact with the patch.   Regional Anesthesia Blocks  1. Numbness or the inability to move the "blocked" extremity may last from 3-48 hours after placement. The length of time depends on the medication injected and your individual response to the medication. If the numbness is not going away  after 48 hours, call your surgeon.  2. The extremity that is blocked will need to be protected until the numbness is gone and the  Strength has returned. Because you cannot feel it, you will need to take extra care to avoid injury. Because it may be weak, you may have difficulty moving it or using it. You may not know what position it is in without looking at it while the block is in effect.  3. For blocks in the legs and feet, returning to weight bearing and walking needs to be done carefully. You will need to wait until the numbness is entirely gone and the strength has returned. You should be able to move your leg and foot normally before you try and bear weight or walk. You will need someone to be with you when you first try to ensure you do not fall and possibly risk injury.  4. Bruising and tenderness at the needle site are common side effects and will resolve in a few days.  5. Persistent numbness or new problems with movement should be communicated to the surgeon or the [place] [phone] [place] [phone]).

## 2019-04-13 NOTE — Anesthesia Preprocedure Evaluation (Signed)
Anesthesia Evaluation  Patient identified by MRN, date of birth, ID band Patient awake    Reviewed: Allergy & Precautions, NPO status , Patient's Chart, lab work & pertinent test results  Airway Mallampati: II  TM Distance: >3 FB Neck ROM: Full    Dental no notable dental hx. (+) Teeth Intact   Pulmonary Current Smoker and Patient abstained from smoking.,    Pulmonary exam normal breath sounds clear to auscultation       Cardiovascular Exercise Tolerance: Good negative cardio ROS Normal cardiovascular exam Rhythm:Regular Rate:Normal     Neuro/Psych negative neurological ROS  negative psych ROS   GI/Hepatic negative GI ROS, Neg liver ROS,   Endo/Other  Hyperthyroidism   Renal/GU negative Renal ROS     Musculoskeletal negative musculoskeletal ROS (+)   Abdominal   Peds  Hematology negative hematology ROS (+)   Anesthesia Other Findings   Reproductive/Obstetrics negative OB ROS                             Anesthesia Physical Anesthesia Plan  ASA: II  Anesthesia Plan: Regional   Post-op Pain Management:    Induction: Intravenous  PONV Risk Score and Plan: 3 and 4 or greater and Treatment may vary due to age or medical condition, Ondansetron and Dexamethasone  Airway Management Planned: Natural Airway and Simple Face Mask  Additional Equipment:   Intra-op Plan:   Post-operative Plan:   Informed Consent: I have reviewed the patients History and Physical, chart, labs and discussed the procedure including the risks, benefits and alternatives for the proposed anesthesia with the patient or authorized representative who has indicated his/her understanding and acceptance.     Dental advisory given  Plan Discussed with: CRNA  Anesthesia Plan Comments:         Anesthesia Quick Evaluation

## 2019-04-13 NOTE — Op Note (Signed)
PREOPERATIVE DIAGNOSIS: Dog bite right ring finger with open distal phalanx fracture  POSTOPERATIVE DIAGNOSIS: Same  ATTENDING SURGEON: Dr. Iran Planas who is scrubbed and present for the entire procedure  ASSISTANT SURGEON:none  ANESTHESIA: Regional with IV sedation  OPERATIVE PROCEDURE: #1: Open treatment of right ring finger distal phalanx fracture requiring internal fixation #2: Open treatment of right ring finger distal phalanx fracture debridement of skin subcutaneous tissue and bone associated with open fracture #3: Repair right ring finger nailbed #4: Radiographs 2 views right ring finger  IMPLANTS: One 0.035 K wire  RADIOGRAPHIC INTERPRETATION: AP lateral views of the finger do show the K wire across the distal interphalangeal joint with good alignment of the distal phalanx  SURGICAL INDICATIONS: Patient is a right-hand-dominant female sustained the open injury after dog bite.  Patient seen evaluate in the office and recommended undergo the above procedure.  Risk benefits and alternatives discussed in detail with the patient and signed informed consent was obtained on the day of procedure.  SURGICAL TECHNIQUE: Patient was palpated via the preoperative holding area marked permanent marker made on the right ring finger to indicate the correct operative site.  Patient brought back the operating placed supine on the anesthesia room table where the IV sedation was administered.  Patient did not already undergone the regional block.  IV antibiotics were given prior to skin incision.  Well-padded tourniquet placed on the right brachium seal with the appropriate drape.  The right upper extremity then prepped and draped normal sterile fashion.  Timeout was called the correct site was identified the procedure then begun.  Tension of the right ring finger the nail plate was then removed with a Soil scientist.  Fracture site was opened up and open debridement was then carried out of the open  fracture site the devitalized tissue and hematoma around the bone was then removed with a small curettes and rongeurs.  The open fracture site was then thoroughly irrigated and debrided.  Following open debridement of the open fracture K wire was then placed from an antegrade position then back retrograde across the fracture site into the distal interphalangeal joint for purchase.  Position was then confirmed using the mini C arm.  The K wire was then cut left bent out at the skin.  Following this the nailbed was then repaired using simple chromic suture.  The backcut made in the eponychial him to expose the nailbed was then closed with simple chromic suture.  The nail plate was then placed over the nailbed repair for protective dressing.  The wound was irrigated.  Xeroform dressing a sterile compressive bandage applied.  Tourniquet deflated there is good perfusion of the finger.  Patient placed in a small finger splint taken recovery room in good condition.  POSTOPERATIVE PLAN: Patient be discharged to home.  See her back in the office in 9 days for pin check x-rays back in with small protective dressing and splint for total of 4 weeks with a K wire out of 4 weeks and then gradual use and activity.  Home on oral pain medicine and oral antibiotics.  Radiographs each visit.

## 2019-04-13 NOTE — Transfer of Care (Signed)
Immediate Anesthesia Transfer of Care Note  Patient: Taylor Dawson  Procedure(s) Performed: precutaneous pinning with repair (Right Hand)  Patient Location: PACU  Anesthesia Type:MAC combined with regional for post-op pain  Level of Consciousness: awake, alert , oriented and patient cooperative  Airway & Oxygen Therapy: Patient Spontanous Breathing and Patient connected to nasal cannula oxygen  Post-op Assessment: Report given to RN, Post -op Vital signs reviewed and stable and Patient moving all extremities  Post vital signs: Reviewed and stable  Last Vitals:  Vitals Value Taken Time  BP    Temp    Pulse 56 04/13/19 1602  Resp 19 04/13/19 1602  SpO2 100 % 04/13/19 1602  Vitals shown include unvalidated device data.  Last Pain:  Vitals:   04/13/19 1314  TempSrc: Oral  PainSc: 8       Patients Stated Pain Goal: 3 (89/84/21 0312)  Complications: No apparent anesthesia complications

## 2019-04-13 NOTE — Anesthesia Procedure Notes (Signed)
Anesthesia Regional Block: Supraclavicular block   Pre-Anesthetic Checklist: ,, timeout performed, Correct Patient, Correct Site, Correct Laterality, Correct Procedure, Correct Position, site marked, Risks and benefits discussed,  Surgical consent,  Pre-op evaluation,  At surgeon's request and post-op pain management  Laterality: Right  Prep: chloraprep       Needles:  Injection technique: Single-shot  Needle Type: Echogenic Needle     Needle Length: 5cm  Needle Gauge: 21     Additional Needles:   Procedures:,,,, ultrasound used (permanent image in chart),,,,  Narrative:  Start time: 04/13/2019 1:54 PM End time: 04/13/2019 2:05 PM Injection made incrementally with aspirations every 5 mL.  Performed by: Personally  Anesthesiologist: Barnet Glasgow, MD

## 2019-04-13 NOTE — Progress Notes (Signed)
Assisted Dr. Houser with right, ultrasound guided, supraclavicular block. Side rails up, monitors on throughout procedure. See vital signs in flow sheet. Tolerated Procedure well. 

## 2019-04-14 NOTE — Anesthesia Postprocedure Evaluation (Signed)
Anesthesia Post Note  Patient: Taylor Dawson  Procedure(s) Performed: precutaneous pinning with repair (Right Hand)     Patient location during evaluation: PACU Anesthesia Type: Regional Level of consciousness: awake and alert Pain management: pain level controlled Vital Signs Assessment: post-procedure vital signs reviewed and stable Respiratory status: spontaneous breathing, nonlabored ventilation, respiratory function stable and patient connected to nasal cannula oxygen Cardiovascular status: stable and blood pressure returned to baseline Postop Assessment: no apparent nausea or vomiting Anesthetic complications: no    Last Vitals:  Vitals:   04/13/19 1730 04/13/19 1741  BP: 99/61 101/64  Pulse: (!) 56 61  Resp: 16   Temp: 36.6 C   SpO2: 97% 98%    Last Pain:  Vitals:   04/13/19 1730  TempSrc:   PainSc: 0-No pain                 Barnet [place]

## 2019-04-19 ENCOUNTER — Encounter (HOSPITAL_BASED_OUTPATIENT_CLINIC_OR_DEPARTMENT_OTHER): Payer: Self-pay | Admitting: Orthopedic Surgery

## 2020-05-15 IMAGING — CR RIGHT RING FINGER 2+V
3 series · 3 of 3 positions shown · non-contrast
Comparison: None.

CLINICAL DATA: Dog bite.

EXAM:
RIGHT RING FINGER 2+V

[x finger pa right]
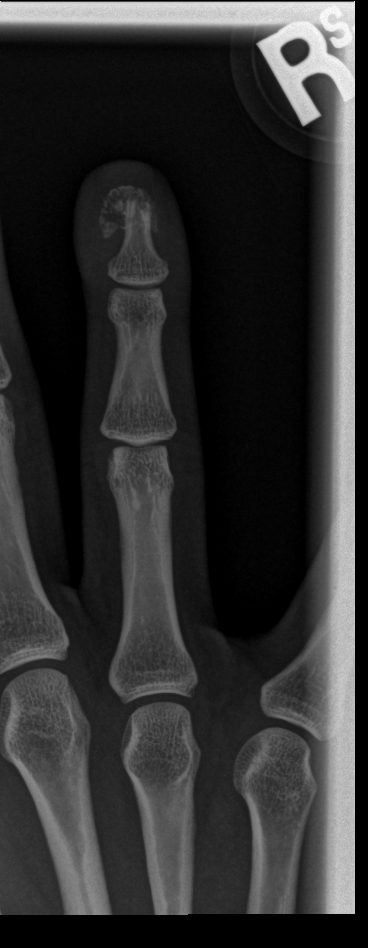

[x finger obl right]
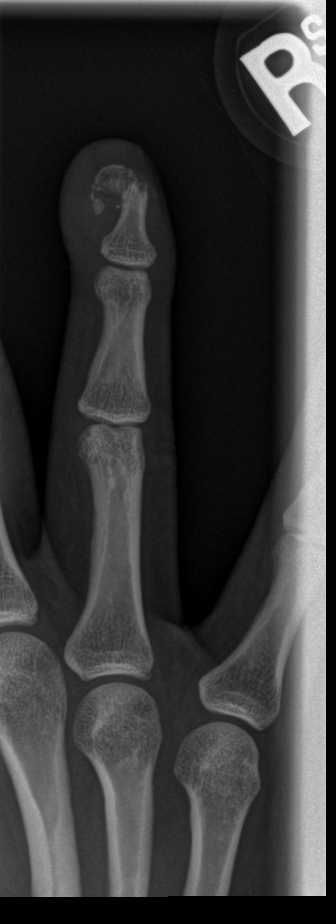

[x finger lat right]
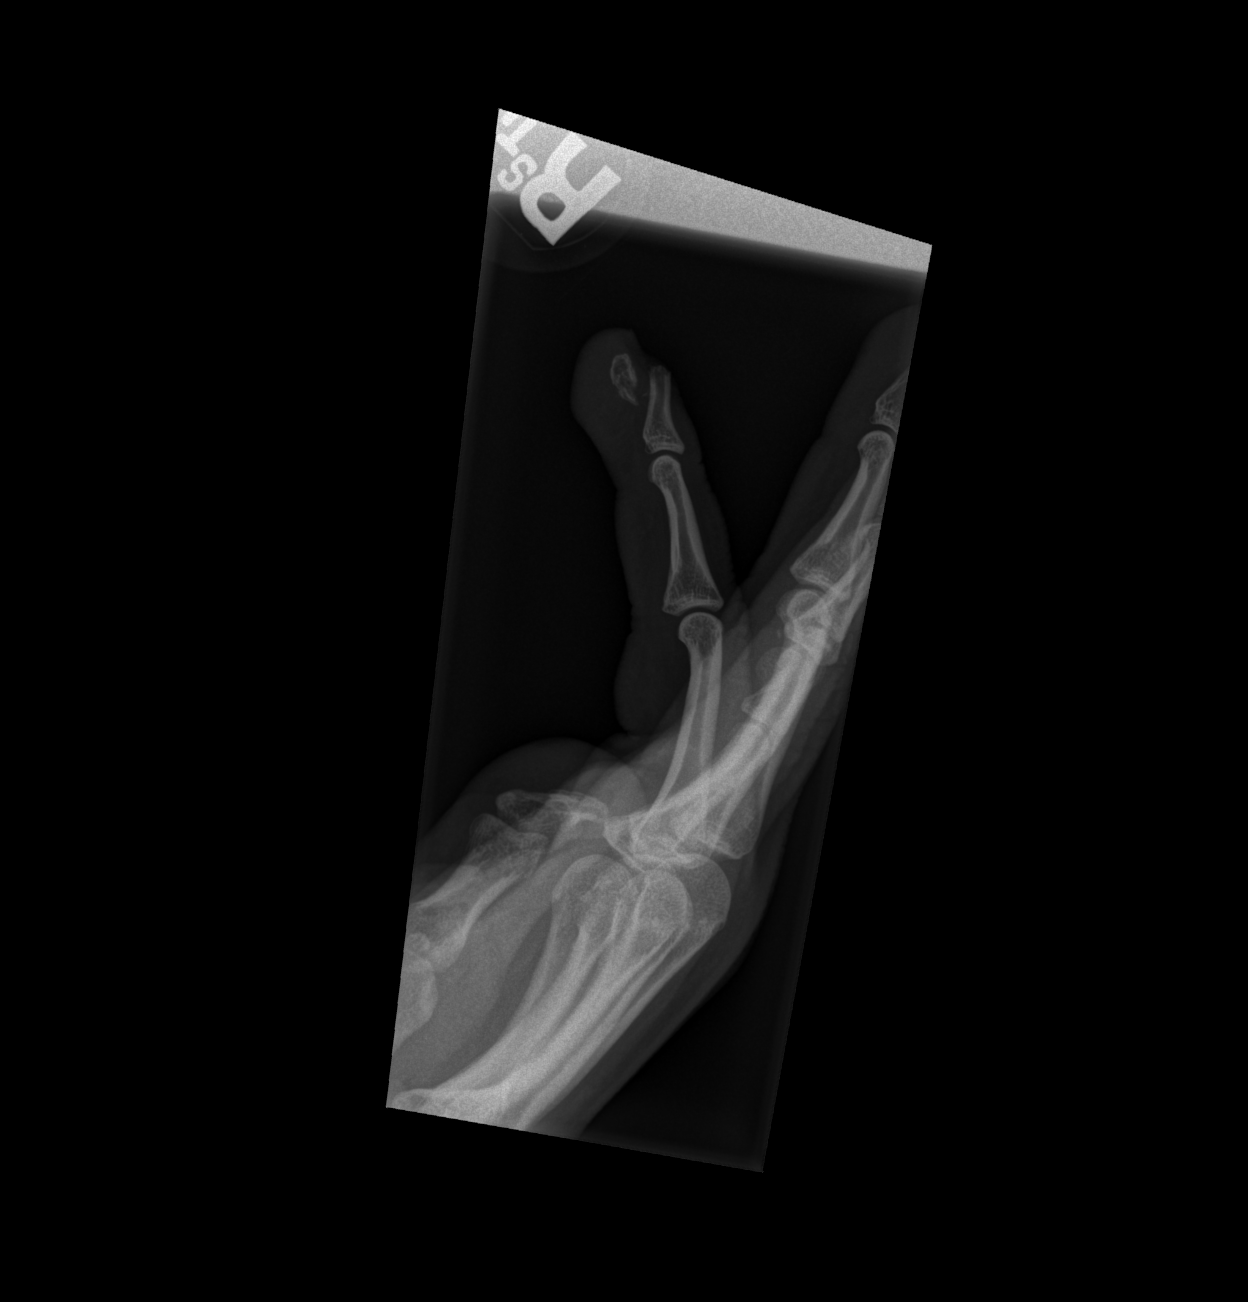

[3 of 3 positions shown; findings below may reference images not displayed]

FINDINGS: There is a comminuted displaced fracture through the distal phalanx
of the fourth finger. Based on the lateral view, the fracture
appears to be open. Soft tissue swelling is identified. No foreign
bodies are noted.
IMPRESSION: Open comminuted fracture through the distal fourth phalanx. The
portion of the phalanx proximal 2 the fracture is deviated in a
dorsal direction and appears to pierce the skin.
